# Patient Record
Sex: Male | Born: 1995 | Race: White | Hispanic: No | Marital: Single | Smoking: Never smoker
Health system: Southern US, Community
[De-identification: ages and names within clinical notes are randomized; demographics above are authoritative.]

## PROBLEM LIST (undated history)

## (undated) DIAGNOSIS — M109 Gout, unspecified: Secondary | ICD-10-CM

---
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
## 2016-04-06 ENCOUNTER — Other Ambulatory Visit: Payer: Self-pay | Admitting: Family Medicine

## 2016-04-06 DIAGNOSIS — R748 Abnormal levels of other serum enzymes: Secondary | ICD-10-CM

## 2016-04-24 ENCOUNTER — Other Ambulatory Visit: Payer: Self-pay

## 2016-05-11 ENCOUNTER — Other Ambulatory Visit: Payer: Self-pay

## 2016-05-22 ENCOUNTER — Ambulatory Visit
Admission: RE | Admit: 2016-05-22 | Discharge: 2016-05-22 | Disposition: A | Discharge status: Home / Self Care | Payer: Self-pay | Source: Ambulatory Visit | Attending: Family Medicine | Admitting: Family Medicine

## 2016-05-22 DIAGNOSIS — R748 Abnormal levels of other serum enzymes: Secondary | ICD-10-CM

## 2017-09-19 IMAGING — US US ABDOMEN COMPLETE
1 series · 14 of 25 positions shown · non-contrast
Comparison: No recent prior.

CLINICAL DATA: Elevated LFTs.

EXAM:
ABDOMEN ULTRASOUND COMPLETE

[Series 1: us abdomen complete · 0.19mm/px · 14 of 78 slices shown]
[im 1/78]
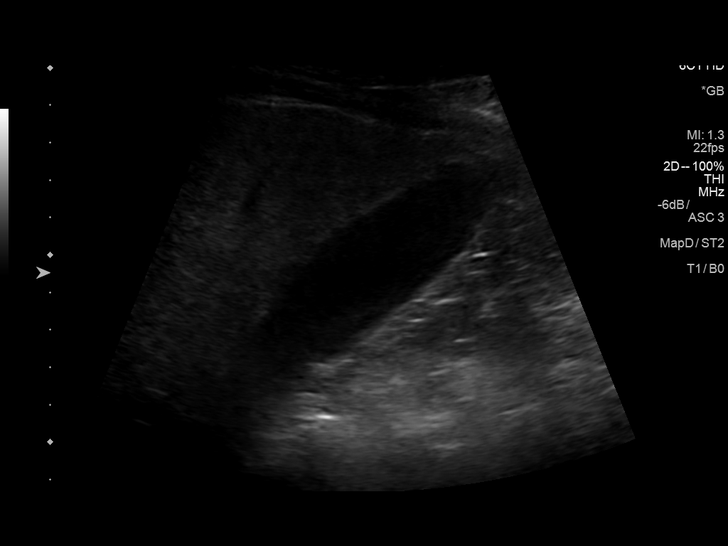
[im 7/78]
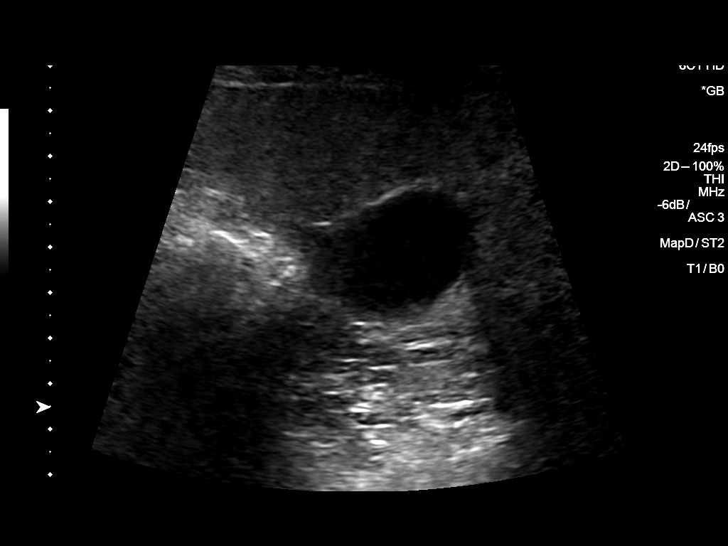
[im 13/78]
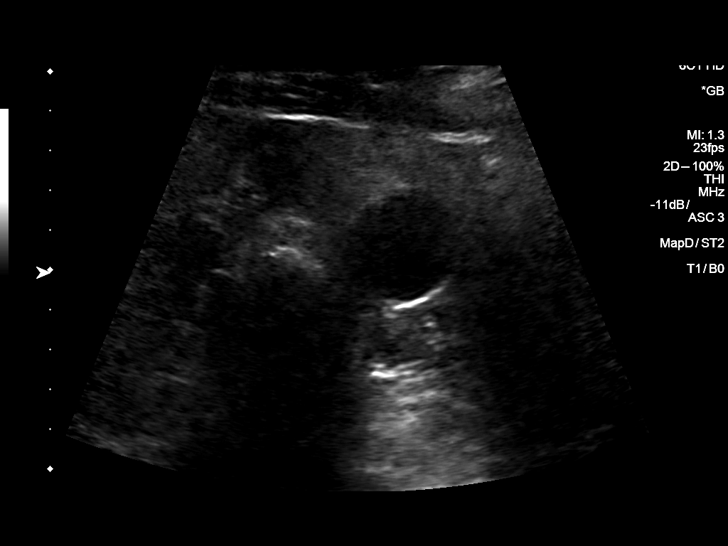
[im 20/78]
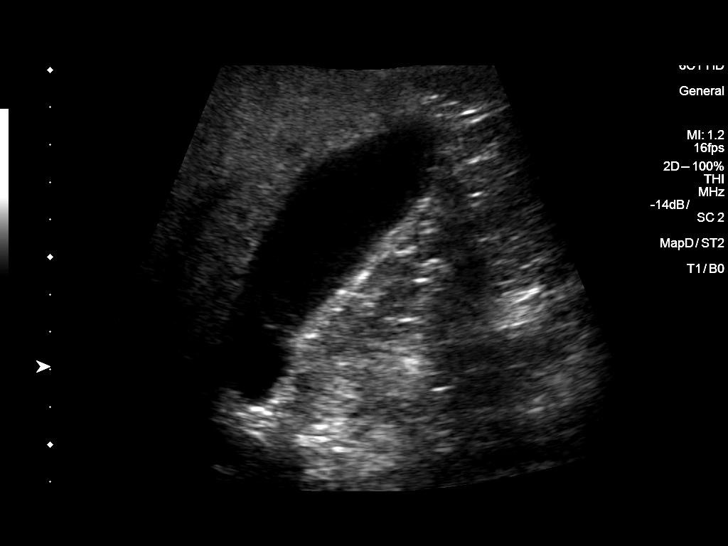
[im 26/78]
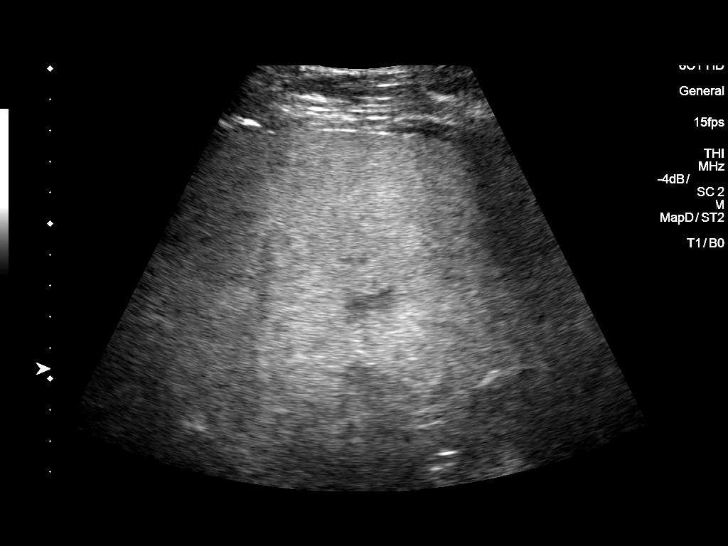
[im 29/78]
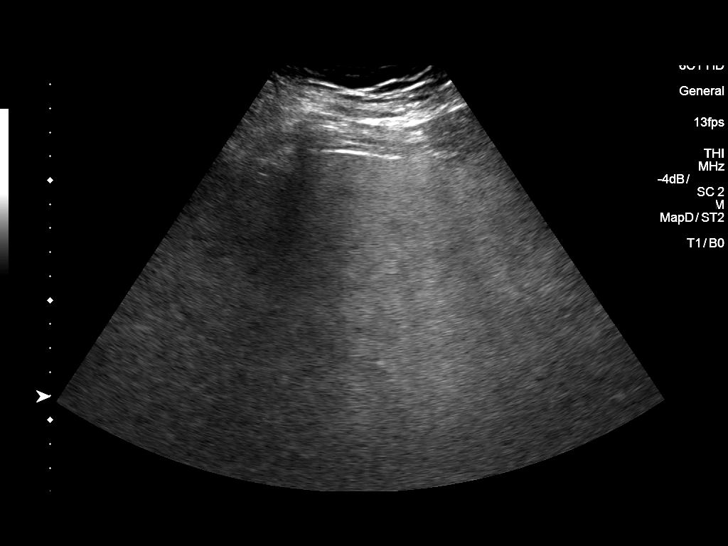
[im 36/78]
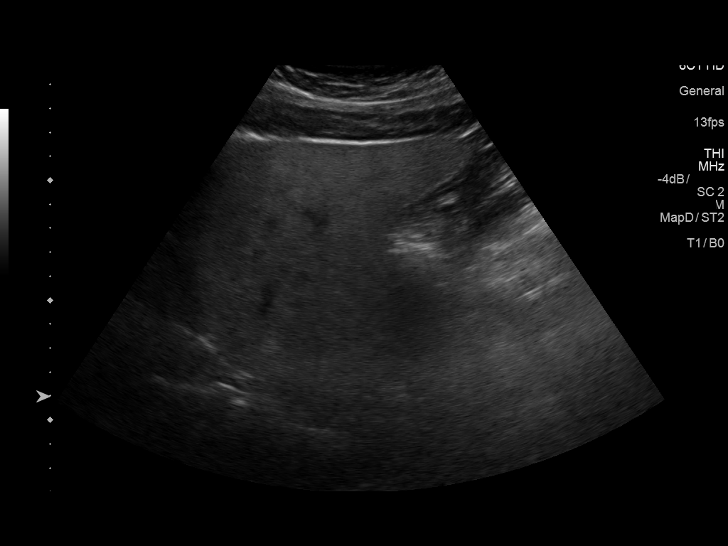
[im 42/78]
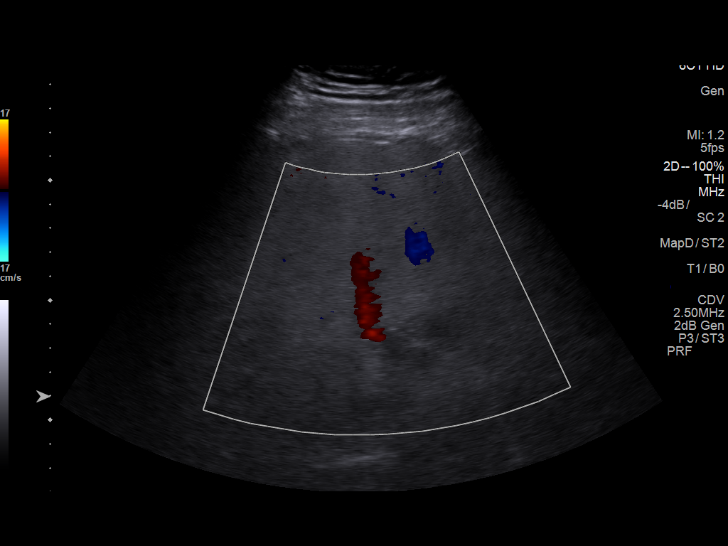
[im 49/78]
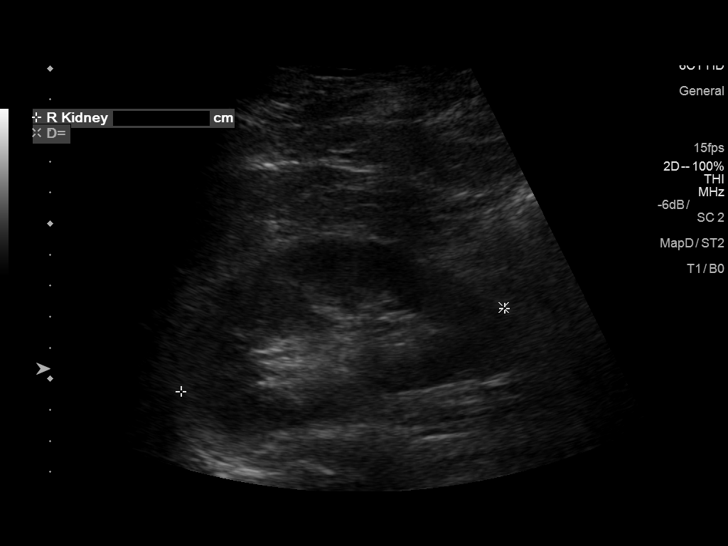
[im 52/78]
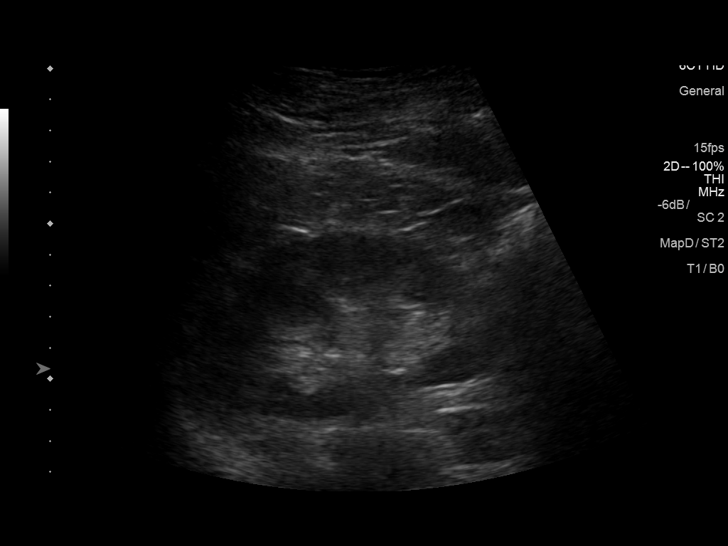
[im 58/78]
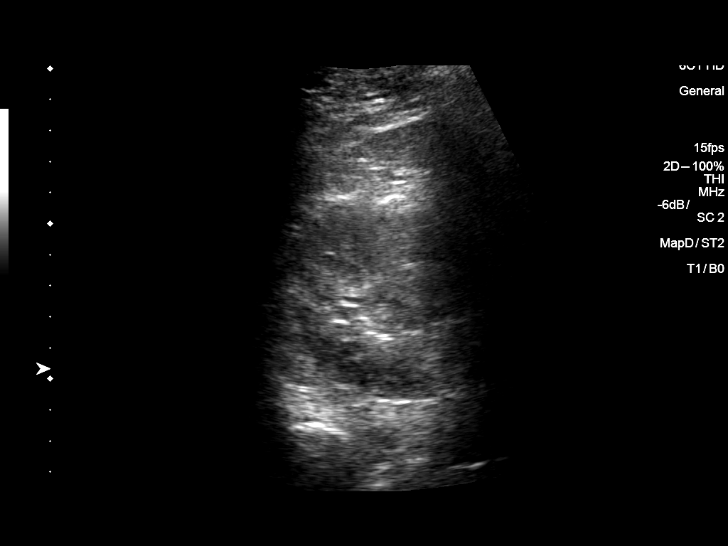
[im 65/78]
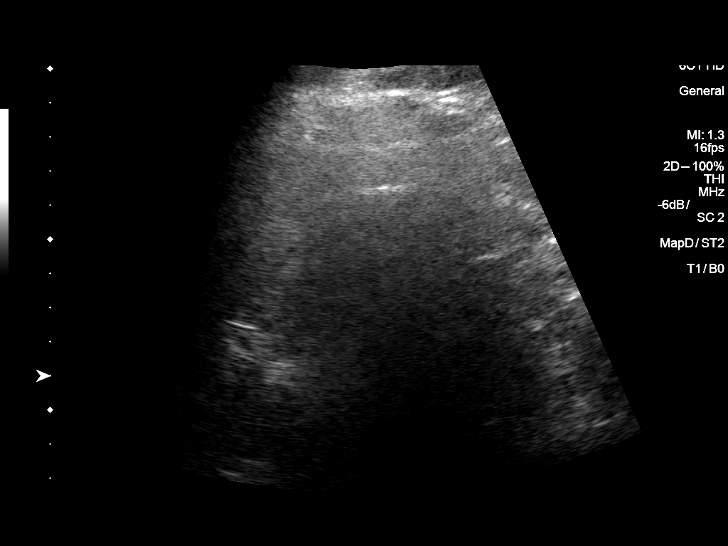
[im 71/78]
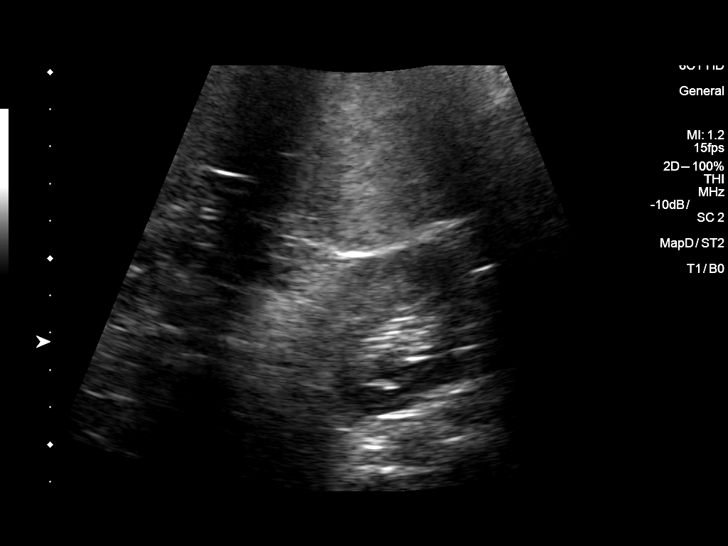
[im 78/78]
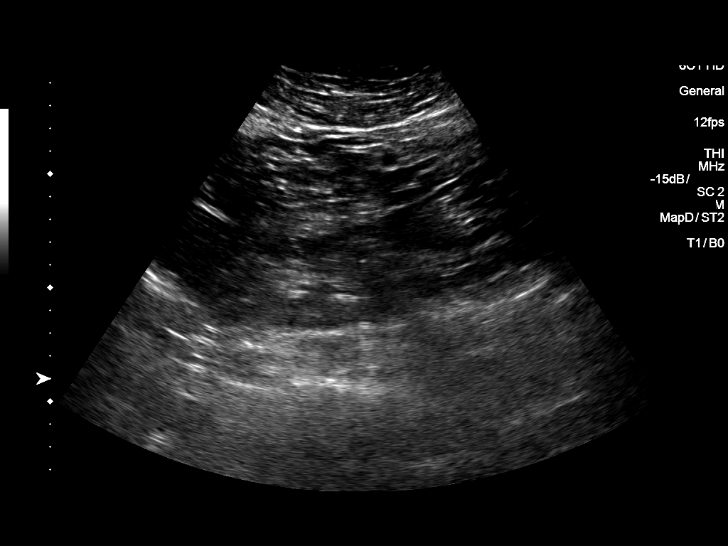

[14 of 25 positions shown; findings below may reference images not displayed]

FINDINGS: Gallbladder: No gallstones or wall thickening visualized. No
sonographic Murphy sign noted by sonographer.

Common bile duct: Diameter: 2.1 mm

Liver: Increased echogenicity consistent fatty infiltration and/or
hepatocellular disease. No focal hepatic abnormality identified.

IVC: No abnormality visualized.

Pancreas: Visualized portion unremarkable.

Spleen: Size and appearance within normal limits.

Right Kidney: Length: 11.1 cm. Echogenicity within normal limits. No
mass or hydronephrosis visualized.

Left Kidney: Length: 10.9 cm. Echogenicity within normal limits. No
mass or hydronephrosis visualized.

Abdominal aorta: No aneurysm visualized.

Other findings: None.
IMPRESSION: 1. The liver echogenic consistent with fatty infiltration and/or
hepatocellular disease. No focal hepatic abnormality identified.

2.  No gallstones or biliary distention.

## 2020-12-15 ENCOUNTER — Other Ambulatory Visit: Payer: Self-pay | Admitting: Family Medicine

## 2020-12-15 ENCOUNTER — Emergency Department (HOSPITAL_BASED_OUTPATIENT_CLINIC_OR_DEPARTMENT_OTHER): Payer: BC Managed Care – PPO

## 2020-12-15 ENCOUNTER — Other Ambulatory Visit: Payer: Self-pay

## 2020-12-15 ENCOUNTER — Encounter (HOSPITAL_BASED_OUTPATIENT_CLINIC_OR_DEPARTMENT_OTHER): Payer: Self-pay | Admitting: Emergency Medicine

## 2020-12-15 ENCOUNTER — Emergency Department (HOSPITAL_BASED_OUTPATIENT_CLINIC_OR_DEPARTMENT_OTHER)
Admission: EM | Admit: 2020-12-15 | Discharge: 2020-12-15 | Disposition: A | Discharge status: Home / Self Care | Payer: BC Managed Care – PPO | Attending: Emergency Medicine | Admitting: Emergency Medicine

## 2020-12-15 DIAGNOSIS — R7401 Elevation of levels of liver transaminase levels: Secondary | ICD-10-CM | POA: Diagnosis not present

## 2020-12-15 DIAGNOSIS — K219 Gastro-esophageal reflux disease without esophagitis: Secondary | ICD-10-CM | POA: Insufficient documentation

## 2020-12-15 DIAGNOSIS — R1013 Epigastric pain: Secondary | ICD-10-CM

## 2020-12-15 DIAGNOSIS — R1011 Right upper quadrant pain: Secondary | ICD-10-CM

## 2020-12-15 DIAGNOSIS — R7989 Other specified abnormal findings of blood chemistry: Secondary | ICD-10-CM

## 2020-12-15 HISTORY — DX: Gout, unspecified: M10.9

## 2020-12-15 LAB — COMPREHENSIVE METABOLIC PANEL
ALT: 156 U/L — ABNORMAL HIGH (ref 0–44)
AST: 47 U/L — ABNORMAL HIGH (ref 15–41)
Albumin: 4.6 g/dL (ref 3.5–5.0)
Alkaline Phosphatase: 59 U/L (ref 38–126)
Anion gap: 9 (ref 5–15)
BUN: 16 mg/dL (ref 6–20)
CO2: 24 mmol/L (ref 22–32)
Calcium: 8.9 mg/dL (ref 8.9–10.3)
Chloride: 106 mmol/L (ref 98–111)
Creatinine, Ser: 1.09 mg/dL (ref 0.61–1.24)
GFR, Estimated: >60 mL/min (ref >60)
Glucose, Bld: 83 mg/dL (ref 70–99)
Potassium: 3.8 mmol/L (ref 3.5–5.1)
Sodium: 139 mmol/L (ref 135–145)
Total Bilirubin: 0.5 mg/dL (ref 0.3–1.2)
Total Protein: 6.9 g/dL (ref 6.5–8.1)

## 2020-12-15 LAB — URINALYSIS, ROUTINE W REFLEX MICROSCOPIC
Bilirubin Urine: NEGATIVE
Glucose, UA: NEGATIVE mg/dL
Hgb urine dipstick: NEGATIVE
Ketones, ur: NEGATIVE mg/dL
Leukocytes,Ua: NEGATIVE
Nitrite: NEGATIVE
Protein, ur: NEGATIVE mg/dL
Specific Gravity, Urine: 1.027 (ref 1.005–1.030)
pH: 7 (ref 5.0–8.0)

## 2020-12-15 LAB — CBC
HCT: 45.9 % (ref 39.0–52.0)
Hemoglobin: 16.1 g/dL (ref 13.0–17.0)
MCH: 29.6 pg (ref 26.0–34.0)
MCHC: 35.1 g/dL (ref 30.0–36.0)
MCV: 84.4 fL (ref 80.0–100.0)
Platelets: 190 10*3/uL (ref 150–400)
RBC: 5.44 MIL/uL (ref 4.22–5.81)
RDW: 11.9 % (ref 11.5–15.5)
WBC: 9 10*3/uL (ref 4.0–10.5)
nRBC: 0 % (ref 0.0–0.2)

## 2020-12-15 LAB — LIPASE, BLOOD: Lipase: 28 U/L (ref 11–51)

## 2020-12-15 MED ORDER — ONDANSETRON HCL 4 MG/2ML IJ SOLN
4.0000 mg | Freq: Once | INTRAMUSCULAR | Status: AC
  Administered 2020-12-15: 4 mg via INTRAVENOUS
  Filled 2020-12-15: qty 2

## 2020-12-15 MED ORDER — IOHEXOL 350 MG/ML SOLN
100.0000 mL | Freq: Once | INTRAVENOUS | Status: AC | PRN
  Administered 2020-12-15: 75 mL via INTRAVENOUS

## 2020-12-15 MED ORDER — PANTOPRAZOLE SODIUM 20 MG PO TBEC: 20.0000 mg | DELAYED_RELEASE_TABLET | Freq: Every day | ORAL | 0 refills | Status: AC

## 2020-12-15 MED ORDER — PANTOPRAZOLE SODIUM 40 MG IV SOLR
40.0000 mg | Freq: Once | INTRAVENOUS | Status: AC
  Administered 2020-12-15: 40 mg via INTRAVENOUS
  Filled 2020-12-15: qty 40

## 2020-12-15 MED ORDER — MORPHINE SULFATE (PF) 4 MG/ML IV SOLN
4.0000 mg | Freq: Once | INTRAVENOUS | Status: AC
  Administered 2020-12-15: 4 mg via INTRAVENOUS
  Filled 2020-12-15: qty 1

## 2020-12-15 NOTE — ED Triage Notes (Signed)
RUQ pain x 4 days. Denies N/V. Saw PCP today and had bloodwork but unable to do [place]. Pain radiates into chest and he feels like he has indigestion.

## 2020-12-15 NOTE — Discharge Instructions (Addendum)
1.  Many of your symptoms sound like you have significant gastroesophageal reflux disease (heartburn).  You need to start taking Protonix as prescribed on a daily basis.  Also follow dietary instructions.  Must follow-up with your family doctor because you might also need an upper endoscopy to look for ulcers. 2.  Your liver function numbers are mildly elevated.  You describe fairly regular alcohol consumption.  It is very important that you stop drinking and continue to have your liver function monitored by your doctor.  Some additional tests for infectious causes for elevated liver function numbers have been added but will not be available for another day or 2.  You need to follow-up on these with your doctor. 3.  Return to the emergency department if you have increasing pain, develop vomiting or vomiting blood or other concerning symptoms.

## 2020-12-15 NOTE — ED Provider Notes (Signed)
[facility] EMERGENCY DEPT Provider Note   CSN: [id number] Arrival date & time: 12/15/20  1416     History Chief Complaint  Patient presents with   Abdominal Pain    Dwayne Case is a 25 y.o. male.  HPI Patient reports he has been getting pain in his right upper abdomen and central upper abdomen for 2 days.  He reports it was really quite bad on Sunday evening.  Pain was aching in quality and sharp.  He could feel it into his back and up into his chest.  He reports he feels like something is coming up in the center of his chest from his epigastric area.  He reports he has had a lot of problems with heartburn and takes Tums pretty frequently at baseline.  He reports that usually resolves the symptoms.  He went to see his doctor because this was persisting and worsening over 2-day duration.  He had an elevation in his LFTs and the plan was to get an ultrasound but it could not be done for another 2 days.  He was instructed to come to the emergency department if symptoms were persisting or worsening.  Patient does not have any other medical problems.  He typically drinks 1 or 2 beers an evening and sometimes on weekends a little more.    Past Medical History:  Diagnosis Date   Gout     There are no problems to display for this patient.   History reviewed. No pertinent surgical history.     No family history on file.  Social History   Tobacco Use   Smoking status: Never   Smokeless tobacco: Never  Substance Use Topics   Alcohol use: Yes    Home Medications Prior to Admission medications   Medication Sig Start Date End Date Taking? Authorizing Provider  pantoprazole (PROTONIX) 20 MG tablet Take 1 tablet (20 mg total) by mouth daily. 12/15/20  Yes Arby Barrette, MD    Allergies    Augmentin [amoxicillin-pot clavulanate]  Review of Systems   Review of Systems 10 systems reviewed and negative except as per HPI Physical Exam Updated Vital Signs BP  138/79 (BP Location: Left Arm)   Pulse 60   Temp 98 F (36.7 C) (Oral)   Resp 17   Ht 5\' 6"  (1.676 m)   Wt 104.3 kg   SpO2 99%   BMI 37.12 kg/m   Physical Exam Constitutional:      Appearance: He is well-developed.     Comments: Patient is alert and nontoxic.  Mental status clear.  No respiratory distress.  HENT:     Mouth/Throat:     Pharynx: Oropharynx is clear.  Eyes:     Extraocular Movements: Extraocular movements intact.     Conjunctiva/sclera: Conjunctivae normal.  Cardiovascular:     Rate and Rhythm: Normal rate.  Pulmonary:     Effort: Pulmonary effort is normal.     Breath sounds: Normal breath sounds.  Abdominal:     Comments: Abdomen soft.  Moderate right upper quadrant tenderness to palpation moderate epigastric tenderness to palpation.  Lower abdomen nontender.  Musculoskeletal:        General: No swelling or tenderness. Normal range of motion.  Skin:    General: Skin is warm and dry.  Neurological:     General: No focal deficit present.     Mental Status: He is alert and oriented to person, place, and time.     Coordination: Coordination normal.  Psychiatric:  Mood and Affect: Mood normal.    ED Results / Procedures / Treatments   Labs (all labs ordered are listed, but only abnormal results are displayed) Labs Reviewed  COMPREHENSIVE METABOLIC PANEL - Abnormal; Notable for the following components:      Result Value   AST 47 (*)    ALT 156 (*)    All other components within normal limits  LIPASE, BLOOD  CBC  URINALYSIS, ROUTINE W REFLEX MICROSCOPIC  HEPATITIS PANEL, ACUTE    EKG EKG Interpretation  Date/Time:  Wednesday December 15 2020 14:53:22 EDT Ventricular Rate:  87 PR Interval:  154 QRS Duration: 104 QT Interval:  356 QTC Calculation: 428 R Axis:   13 Text Interpretation: Normal sinus rhythm Low voltage QRS Incomplete right bundle branch block Cannot rule out Anterior infarct , age undetermined Abnormal ECG Confirmed by Kennis Carina [phone]) on 12/17/2020 1:12:16 PM  Radiology CT Abdomen Pelvis W Contrast  Result Date: 12/15/2020 CLINICAL DATA:  Right upper quadrant pain EXAM: CT ABDOMEN AND PELVIS WITH CONTRAST TECHNIQUE: Multidetector CT imaging of the abdomen and pelvis was performed using the standard protocol following bolus administration of intravenous contrast. CONTRAST:  25mL OMNIPAQUE IOHEXOL 350 MG/ML SOLN COMPARISON:  Ultrasound 12/15/2020 FINDINGS: Lower chest: No acute abnormality. Hepatobiliary: Hepatic steatosis. No calcified gallstone or biliary dilatation Pancreas: Unremarkable. No pancreatic ductal dilatation or surrounding inflammatory changes. Spleen: Normal in size without focal abnormality. Adrenals/Urinary Tract: Adrenal glands are unremarkable. Kidneys are normal, without renal calculi, focal lesion, or hydronephrosis. Bladder is unremarkable. Stomach/Bowel: Stomach is within normal limits. Appendix appears normal. No evidence of bowel wall thickening, distention, or inflammatory changes. Vascular/Lymphatic: No significant vascular findings are present. No enlarged abdominal or pelvic lymph nodes. Reproductive: Prostate is unremarkable. Other: No abdominal wall hernia or abnormality. No abdominopelvic ascites. Musculoskeletal: No acute or significant osseous findings. IMPRESSION: 1. No CT evidence for acute intra-abdominal or pelvic abnormality. 2. Hepatic steatosis Electronically Signed   By: Jasmine Pang M.D.   On: 12/15/2020 21:40   US Abdomen Limited RUQ (LIVER/GB)  Result Date: 12/15/2020 CLINICAL DATA:  Right upper quadrant pain EXAM: ULTRASOUND ABDOMEN LIMITED RIGHT UPPER QUADRANT COMPARISON:  None. FINDINGS: Gallbladder: No gallstones or wall thickening visualized. No sonographic Murphy sign noted by sonographer. Common bile duct: Diameter: Normal caliber, 3 mm Liver: Increased echotexture compatible with fatty infiltration. No focal abnormality or biliary ductal dilatation. Portal vein is patent  on color Doppler imaging with normal direction of blood flow towards the liver. Other: None. IMPRESSION: Hepatic steatosis. No acute findings. Electronically Signed   By: Charlett Nose M.D.   On: 12/15/2020 20:18    Procedures Procedures   Medications Ordered in ED Medications  morphine 4 MG/ML injection 4 mg (4 mg Intravenous Given 12/15/20 2014)  ondansetron (ZOFRAN) injection 4 mg (4 mg Intravenous Given 12/15/20 2014)  pantoprazole (PROTONIX) injection 40 mg (40 mg Intravenous Given 12/15/20 2038)  iohexol (OMNIPAQUE) 350 MG/ML injection 100 mL (75 mLs Intravenous Contrast Given 12/15/20 2127)    ED Course  I have reviewed the triage vital signs and the nursing notes.  Pertinent labs & imaging results that were available during my care of the patient were reviewed by me and considered in my medical decision making (see chart for details).    MDM Rules/Calculators/A&P                          Patient presents as outlined.  CT does not show  acute findings.  Ultrasound with hepatic steatosis but no other acute findings.  Symptoms are suspicious for gastroesophageal reflux disease this has been reviewed with patient with the plan for treatment.  Also mild LFT elevation.  Patient counseled on avoiding all alcohol and close follow-up with PCP for monitoring.  Return precautions reviewed.  Final Clinical Impression(s) / ED Diagnoses Final diagnoses:  Elevated liver function tests  Gastroesophageal reflux disease, unspecified whether esophagitis present  Epigastric pain    Rx / DC Orders ED Discharge Orders          Ordered    pantoprazole (PROTONIX) 20 MG tablet  Daily        12/15/20 2211             Arby Barrette, MD 01/08/21 1355

## 2020-12-16 ENCOUNTER — Other Ambulatory Visit: Payer: BC Managed Care – PPO

## 2020-12-16 ENCOUNTER — Other Ambulatory Visit: Payer: Self-pay

## 2022-11-01 IMAGING — US US ABDOMEN LIMITED
1 series · 14 of 25 positions shown · non-contrast
Comparison: None.

CLINICAL DATA: Right upper quadrant pain

EXAM:
ULTRASOUND ABDOMEN LIMITED RIGHT UPPER QUADRANT

[Series 1: us abdomen limited · 14 of 66 slices shown]
[im 1/66]
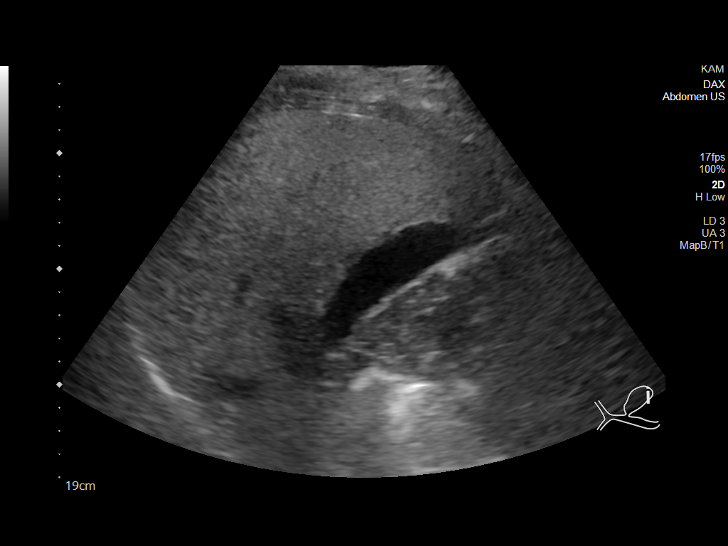
[im 6/66]
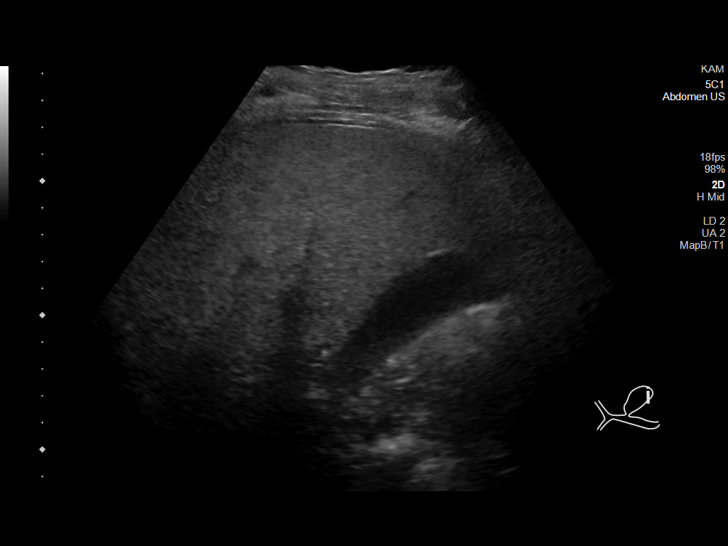
[im 11/66]
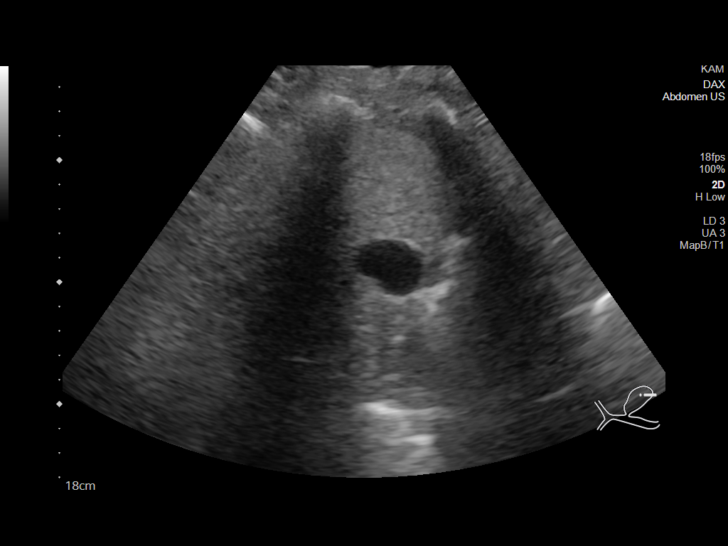
[im 17/66]
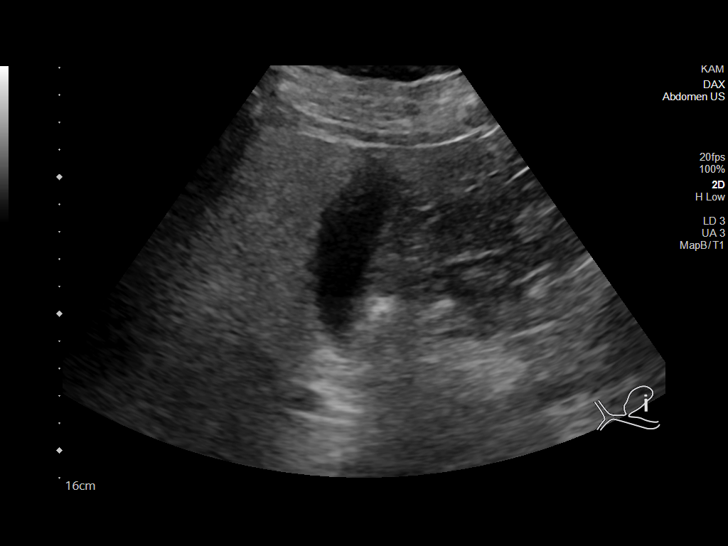
[im 22/66]
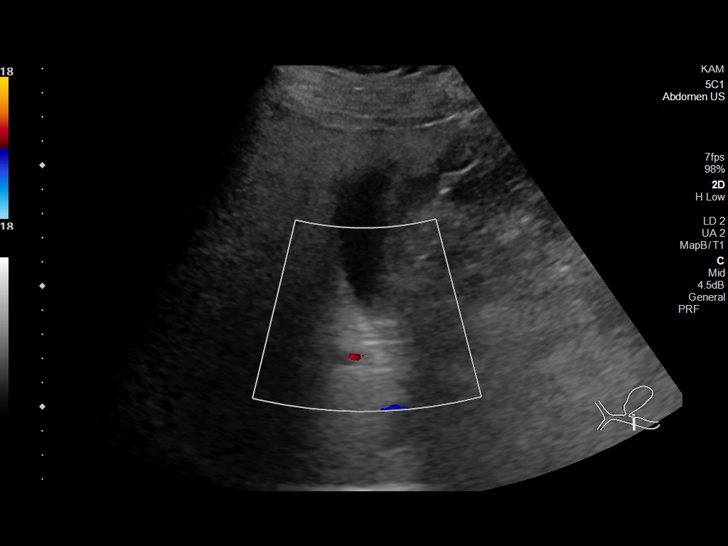
[im 25/66]
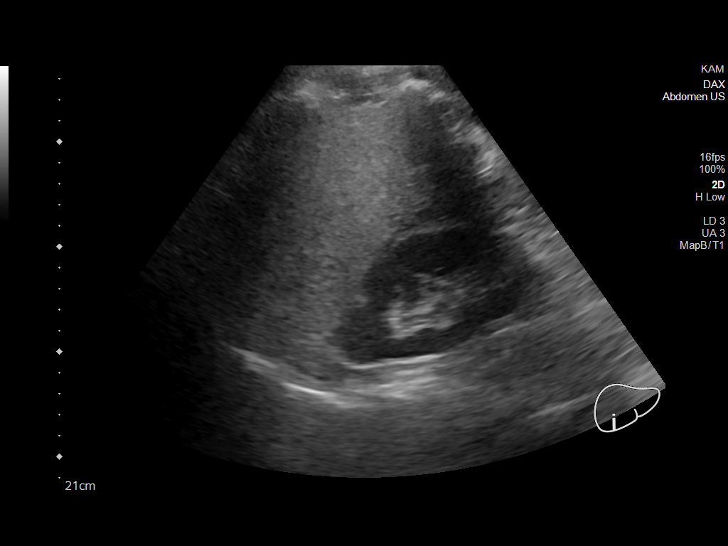
[im 30/66]
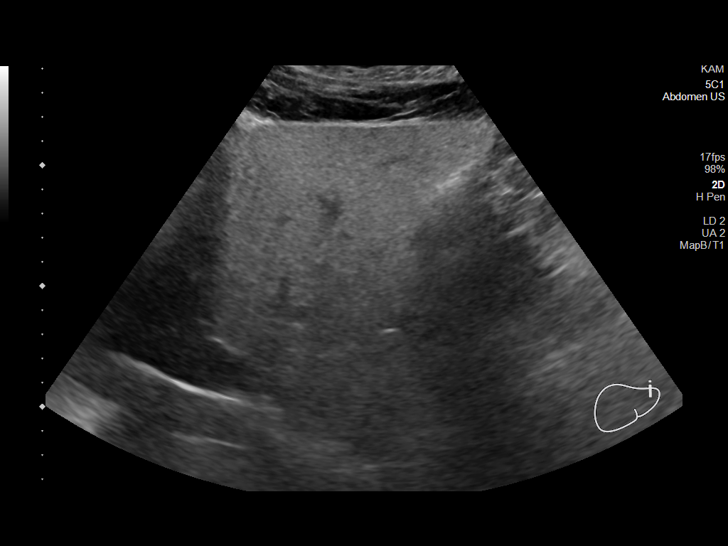
[im 36/66]
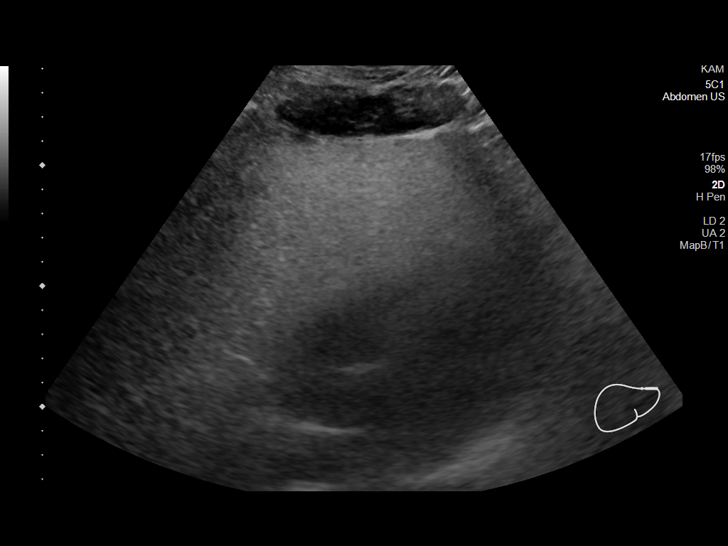
[im 41/66]
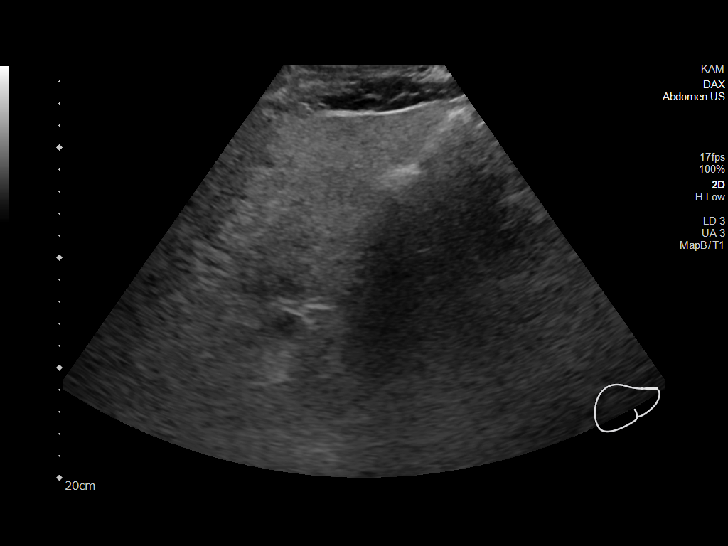
[im 44/66]
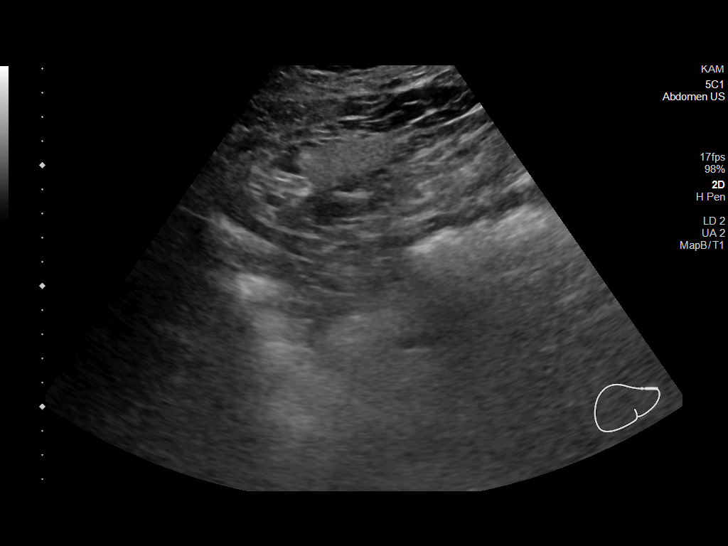
[im 49/66]
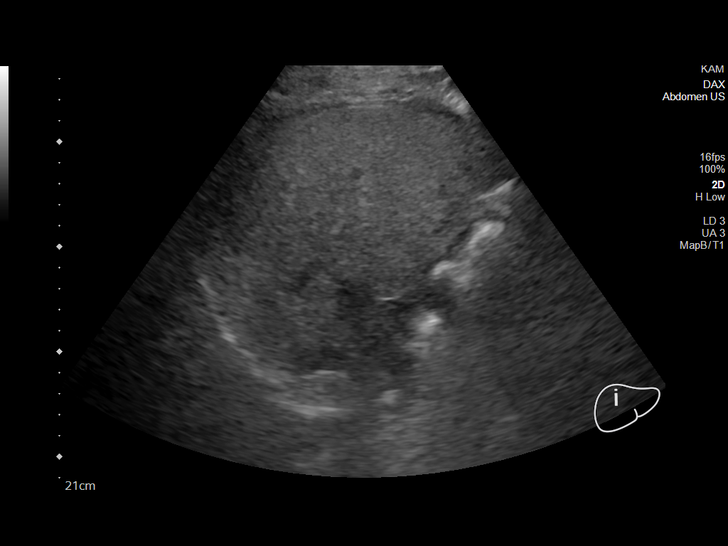
[im 55/66]
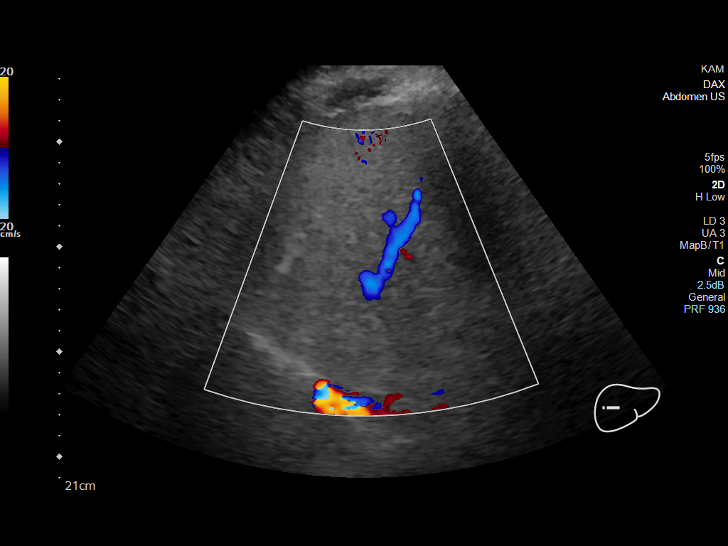
[im 60/66]
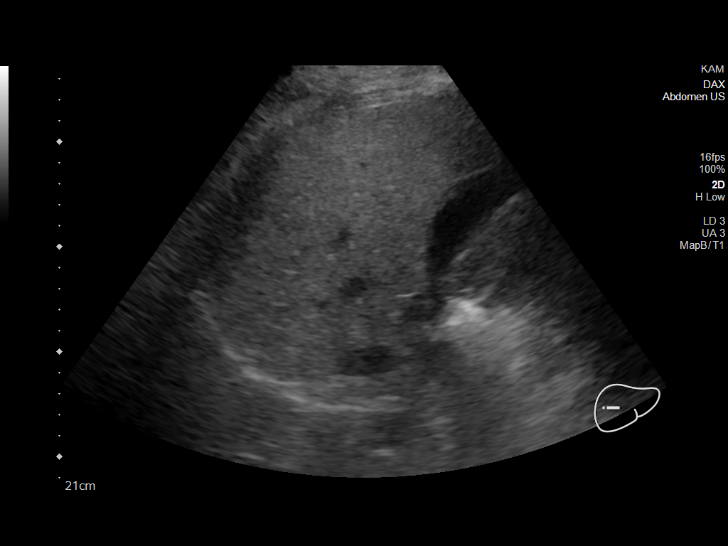
[im 66/66]
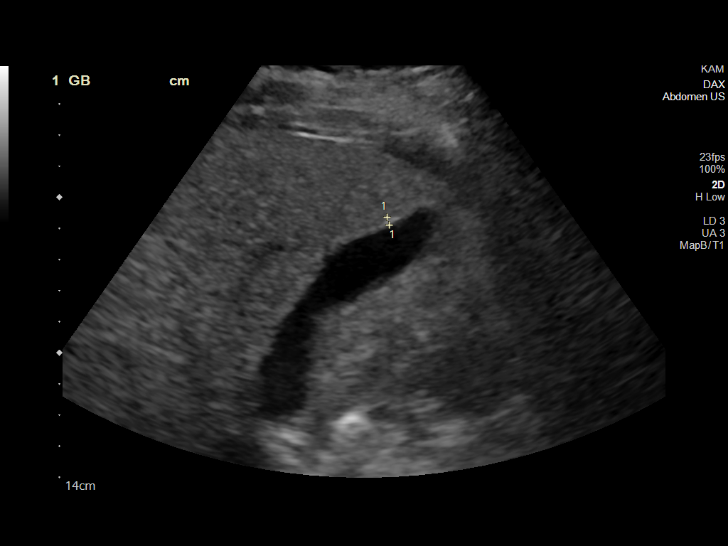

[14 of 25 positions shown; findings below may reference images not displayed]

FINDINGS: Gallbladder:

No gallstones or wall thickening visualized. No sonographic Murphy
sign noted by sonographer.

Common bile duct:

Diameter: Normal caliber, 3 mm

Liver:

Increased echotexture compatible with fatty infiltration. No focal
abnormality or biliary ductal dilatation. Portal vein is patent on
color Doppler imaging with normal direction of blood flow towards
the liver.

Other: None.
IMPRESSION: Hepatic steatosis.

No acute findings.

## 2022-11-01 IMAGING — CT CT ABD-PELV W/ CM
2 of 4 series · 17 of 46 positions shown, 19 images · IV contrast (APPLIED)
Comparison: Ultrasound 12/15/2020

CLINICAL DATA: Right upper quadrant pain

EXAM:
CT ABDOMEN AND PELVIS WITH CONTRAST
TECHNIQUE: Multidetector CT imaging of the abdomen and pelvis was performed
using the standard protocol following bolus administration of
intravenous contrast.
CONTRAST:  75mL OMNIPAQUE IOHEXOL 350 MG/ML SOLN

[Series 2: abd pel w · axial · 0.89mm/px · z∈[-1057,-562]mm · 14 of 109 slices shown, 16 images]
[im 5/109  soft-tissue]
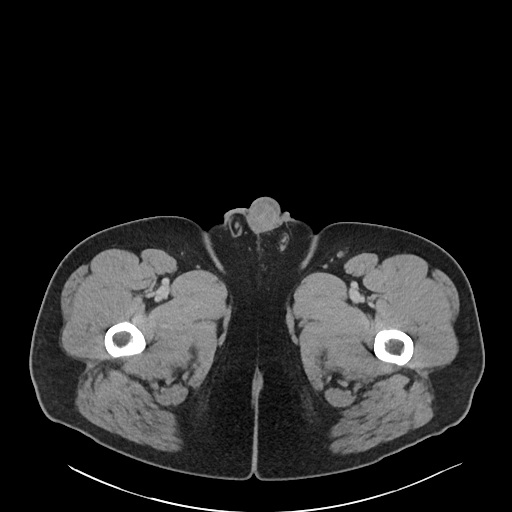
[im 5/109  bone]
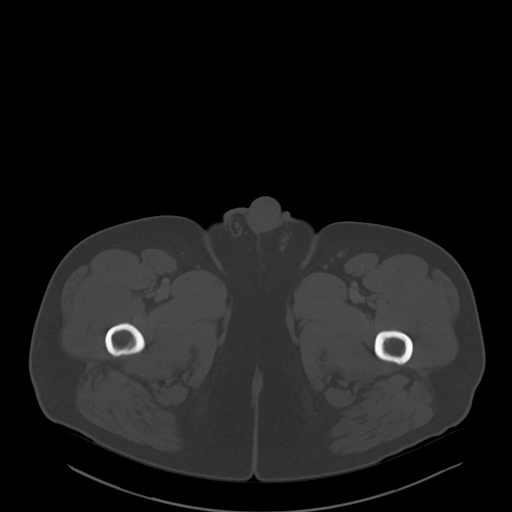
[im 15/109  soft-tissue]
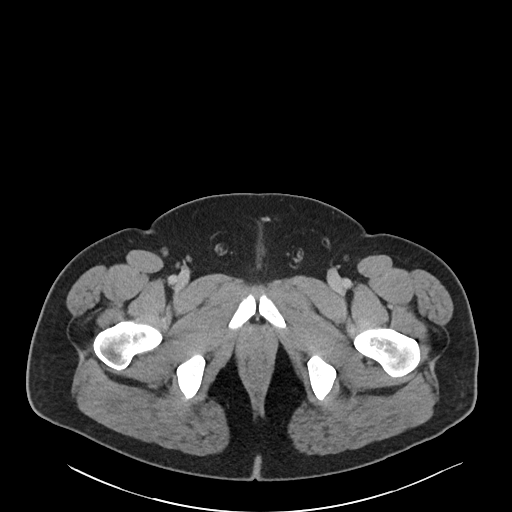
[im 19/109  soft-tissue]
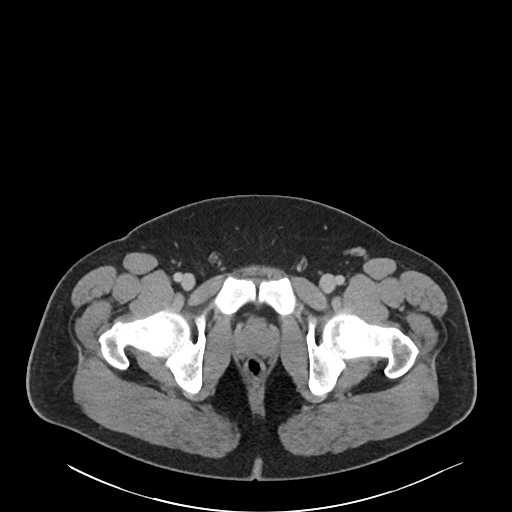
[im 29/109  soft-tissue]
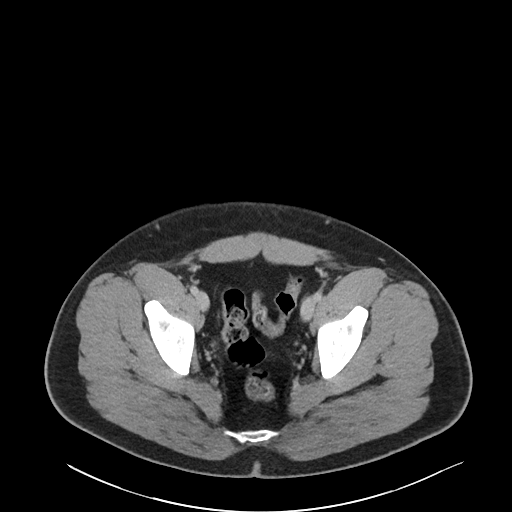
[im 38/109  soft-tissue]
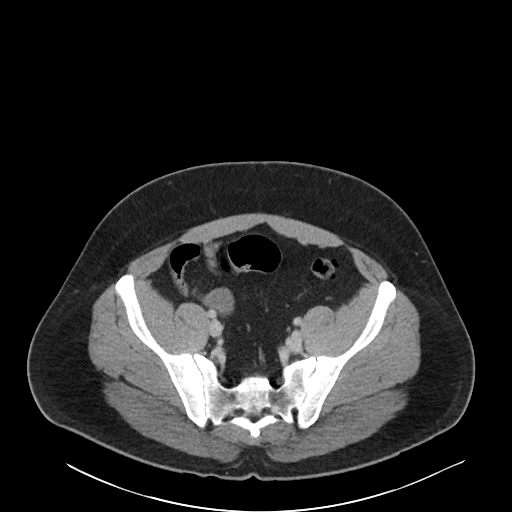
[im 43/109  soft-tissue]
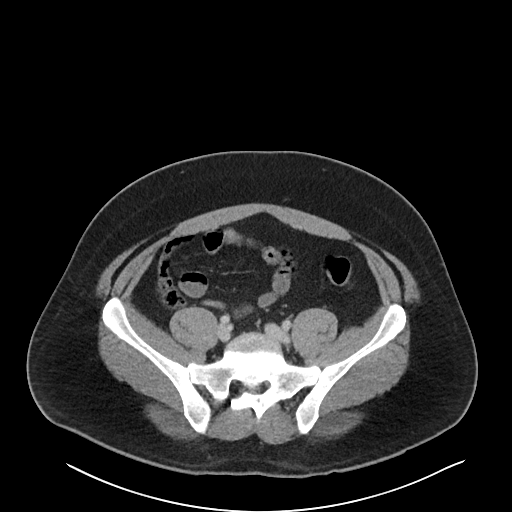
[im 52/109  soft-tissue]
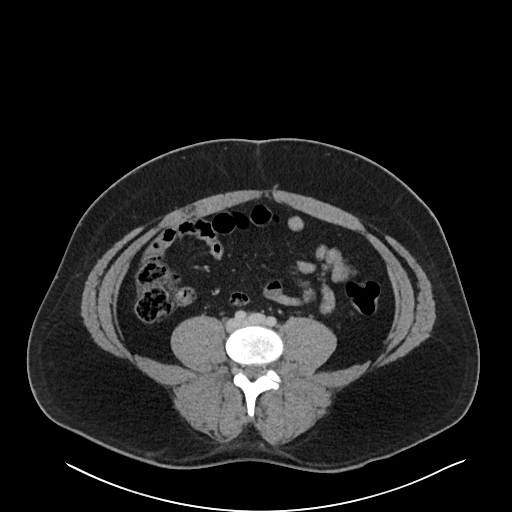
[im 57/109  soft-tissue]
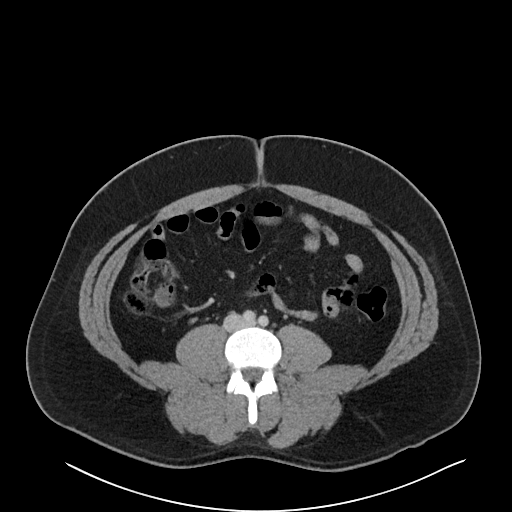
[im 66/109  soft-tissue]
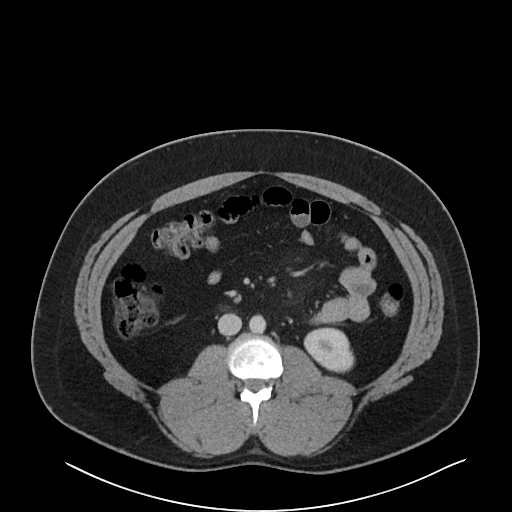
[im 66/109  bone]
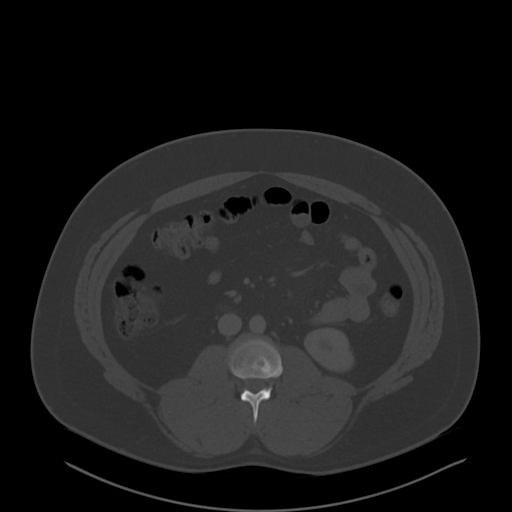
[im 71/109  soft-tissue]
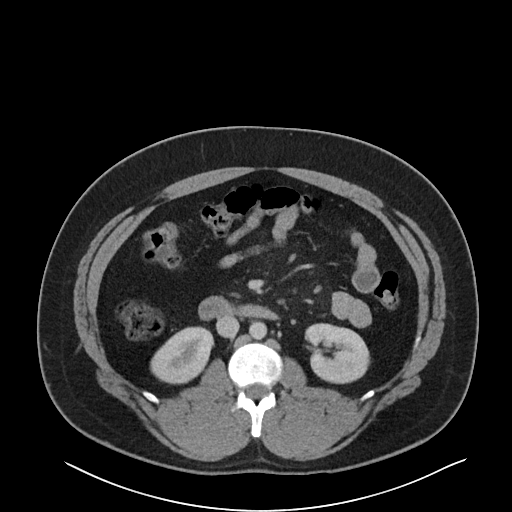
[im 80/109  soft-tissue]
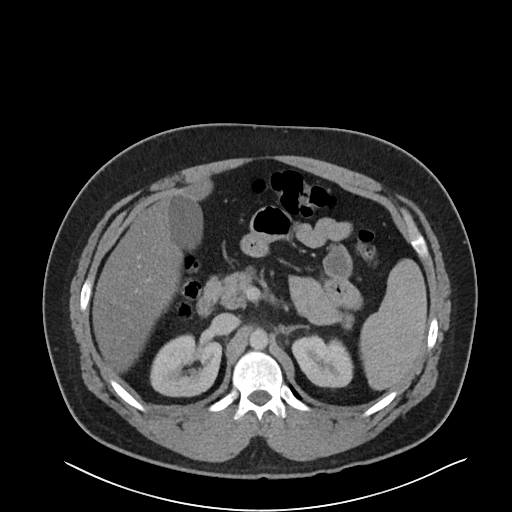
[im 90/109  soft-tissue]
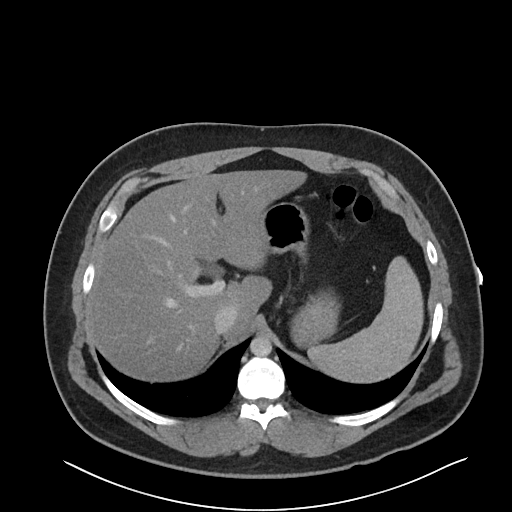
[im 94/109  soft-tissue]
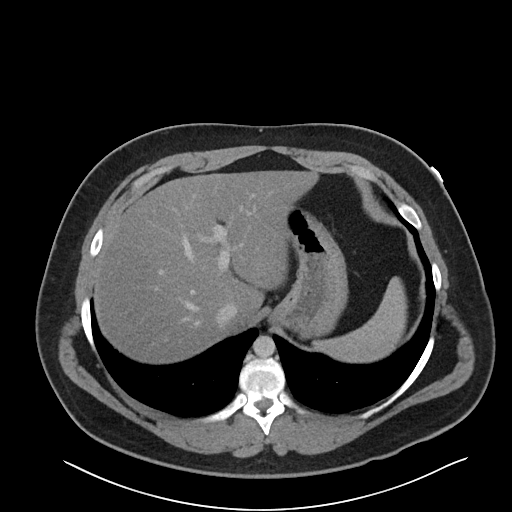
[im 104/109  soft-tissue]
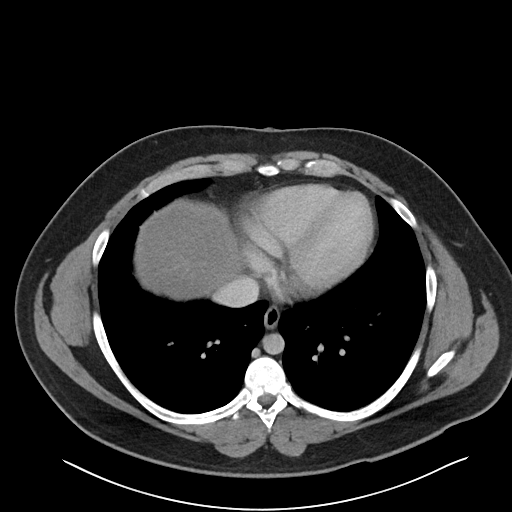

[Series 5: coronal · coronal · 0.93mm/px · 3 of 102 slices shown]
[im 34/102  soft-tissue]
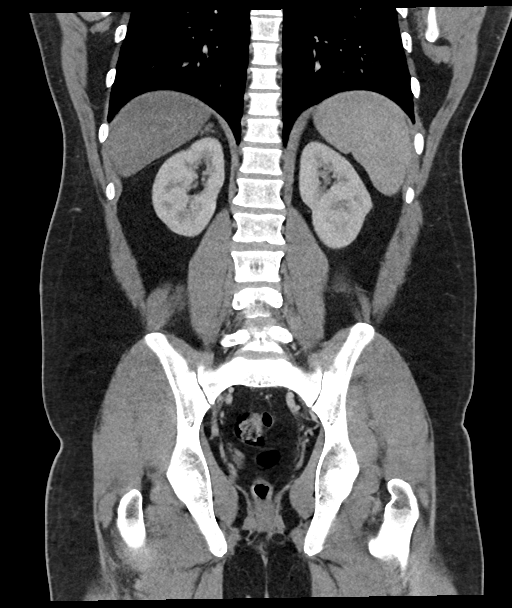
[im 45/102  soft-tissue]
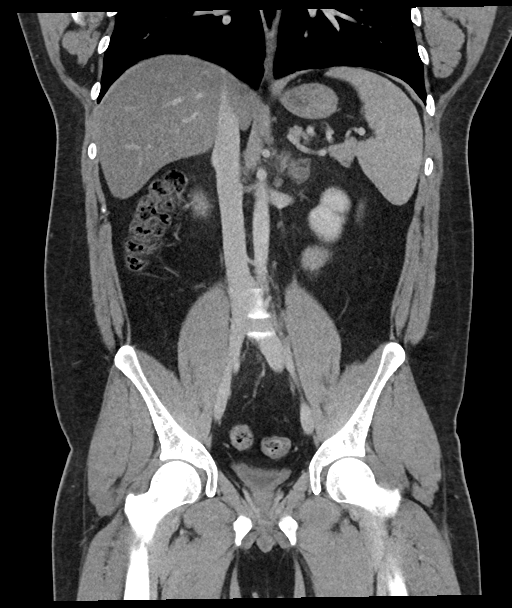
[im 57/102  soft-tissue]
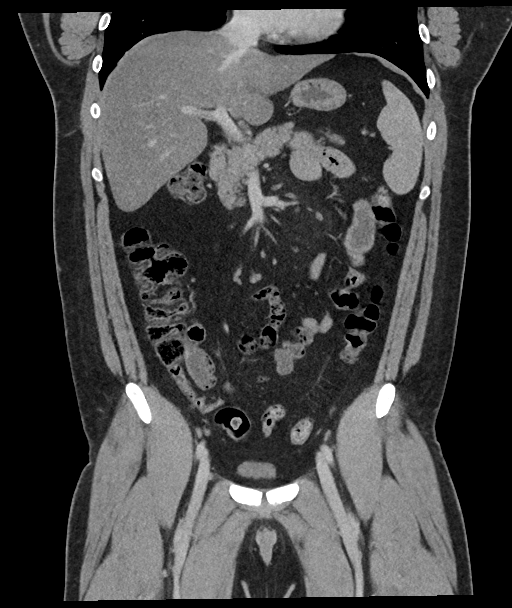

[17 of 46 positions shown; findings below may reference images not displayed]

FINDINGS: Lower chest: No acute abnormality.

Hepatobiliary: Hepatic steatosis. No calcified gallstone or biliary
dilatation

Pancreas: Unremarkable. No pancreatic ductal dilatation or
surrounding inflammatory changes.

Spleen: Normal in size without focal abnormality.

Adrenals/Urinary Tract: Adrenal glands are unremarkable. Kidneys are
normal, without renal calculi, focal lesion, or hydronephrosis.
Bladder is unremarkable.

Stomach/Bowel: Stomach is within normal limits. Appendix appears
normal. No evidence of bowel wall thickening, distention, or
inflammatory changes.

Vascular/Lymphatic: No significant vascular findings are present. No
enlarged abdominal or pelvic lymph nodes.

Reproductive: Prostate is unremarkable.

Other: No abdominal wall hernia or abnormality. No abdominopelvic
ascites.

Musculoskeletal: No acute or significant osseous findings.
IMPRESSION: 1. No CT evidence for acute intra-abdominal or pelvic abnormality.
2. Hepatic steatosis
